# Patient Record
Sex: Female | Born: 1995
Health system: Southern US, Community
[De-identification: ages and names within clinical notes are randomized; demographics above are authoritative.]

## PROBLEM LIST (undated history)

## (undated) DIAGNOSIS — L700 Acne vulgaris: Secondary | ICD-10-CM

## (undated) DIAGNOSIS — F909 Attention-deficit hyperactivity disorder, unspecified type: Secondary | ICD-10-CM

## (undated) HISTORY — DX: Acne vulgaris: L70.0

## (undated) HISTORY — PX: NO PAST SURGERIES: SHX2092

## (undated) HISTORY — DX: Attention-deficit hyperactivity disorder, unspecified type: F90.9

---
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
## 2015-03-17 ENCOUNTER — Inpatient Hospital Stay (HOSPITAL_COMMUNITY)
Admission: AD | Admit: 2015-03-17 | Discharge: 2015-03-17 | Disposition: A | Discharge status: Home / Self Care | Payer: No Typology Code available for payment source | Source: Ambulatory Visit | Attending: Emergency Medicine | Admitting: Emergency Medicine

## 2015-03-17 ENCOUNTER — Inpatient Hospital Stay (HOSPITAL_COMMUNITY): Payer: No Typology Code available for payment source

## 2015-03-17 ENCOUNTER — Encounter (HOSPITAL_COMMUNITY): Payer: Self-pay

## 2015-03-17 DIAGNOSIS — F909 Attention-deficit hyperactivity disorder, unspecified type: Secondary | ICD-10-CM | POA: Diagnosis not present

## 2015-03-17 DIAGNOSIS — Z79899 Other long term (current) drug therapy: Secondary | ICD-10-CM | POA: Diagnosis not present

## 2015-03-17 DIAGNOSIS — R52 Pain, unspecified: Secondary | ICD-10-CM

## 2015-03-17 DIAGNOSIS — R112 Nausea with vomiting, unspecified: Secondary | ICD-10-CM

## 2015-03-17 DIAGNOSIS — Z87828 Personal history of other (healed) physical injury and trauma: Secondary | ICD-10-CM | POA: Insufficient documentation

## 2015-03-17 DIAGNOSIS — N200 Calculus of kidney: Secondary | ICD-10-CM | POA: Diagnosis not present

## 2015-03-17 DIAGNOSIS — Z872 Personal history of diseases of the skin and subcutaneous tissue: Secondary | ICD-10-CM | POA: Diagnosis not present

## 2015-03-17 DIAGNOSIS — R109 Unspecified abdominal pain: Secondary | ICD-10-CM | POA: Diagnosis present

## 2015-03-17 DIAGNOSIS — Z3202 Encounter for pregnancy test, result negative: Secondary | ICD-10-CM | POA: Insufficient documentation

## 2015-03-17 LAB — CBC
HCT: 36.4 % (ref 36.0–46.0)
Hemoglobin: 12.6 g/dL (ref 12.0–15.0)
MCH: 29.4 pg (ref 26.0–34.0)
MCHC: 34.6 g/dL (ref 30.0–36.0)
MCV: 85 fL (ref 78.0–100.0)
Platelets: 294 10*3/uL (ref 150–400)
RBC: 4.28 MIL/uL (ref 3.87–5.11)
RDW: 12.7 % (ref 11.5–15.5)
WBC: 8.2 10*3/uL (ref 4.0–10.5)

## 2015-03-17 LAB — URINALYSIS, ROUTINE W REFLEX MICROSCOPIC
Bilirubin Urine: NEGATIVE
Glucose, UA: NEGATIVE mg/dL
Ketones, ur: NEGATIVE mg/dL
Leukocytes, UA: NEGATIVE
Nitrite: NEGATIVE
PROTEIN: NEGATIVE mg/dL
Specific Gravity, Urine: >1.03 — ABNORMAL HIGH (ref 1.005–1.030)
UROBILINOGEN UA: 0.2 mg/dL (ref 0.0–1.0)
pH: 5.5 (ref 5.0–8.0)

## 2015-03-17 LAB — URINE MICROSCOPIC-ADD ON

## 2015-03-17 LAB — WET PREP, GENITAL
Clue Cells Wet Prep HPF POC: NONE SEEN
Trich, Wet Prep: NONE SEEN
Yeast Wet Prep HPF POC: NONE SEEN

## 2015-03-17 LAB — POCT PREGNANCY, URINE: PREG TEST UR: NEGATIVE

## 2015-03-17 MED ORDER — HYDROMORPHONE HCL 1 MG/ML IJ SOLN
1.0000 mg | Freq: Once | INTRAMUSCULAR | Status: AC
  Administered 2015-03-17: 1 mg via INTRAVENOUS
  Filled 2015-03-17: qty 1

## 2015-03-17 MED ORDER — SODIUM CHLORIDE 0.9 % IV SOLN
25.0000 mg | Freq: Once | INTRAVENOUS | Status: AC
  Administered 2015-03-17: 25 mg via INTRAVENOUS
  Filled 2015-03-17: qty 1

## 2015-03-17 MED ORDER — OXYCODONE-ACETAMINOPHEN 7.5-325 MG PO TABS: 1.0000 | ORAL_TABLET | Freq: Once | ORAL | Status: DC

## 2015-03-17 MED ORDER — TAMSULOSIN HCL 0.4 MG PO CAPS: 0.4000 mg | ORAL_CAPSULE | Freq: Every day | ORAL | Status: DC

## 2015-03-17 MED ORDER — OXYCODONE-ACETAMINOPHEN 5-325 MG PO TABS
1.0000 | ORAL_TABLET | Freq: Once | ORAL | Status: DC
  Filled 2015-03-17: qty 1

## 2015-03-17 MED ORDER — SODIUM CHLORIDE 0.9 % IV SOLN
8.0000 mg | Freq: Once | INTRAVENOUS | Status: AC
  Administered 2015-03-17: 8 mg via INTRAVENOUS
  Filled 2015-03-17: qty 4

## 2015-03-17 MED ORDER — ONDANSETRON HCL 4 MG PO TABS: 4.0000 mg | ORAL_TABLET | Freq: Four times a day (QID) | ORAL | Status: DC

## 2015-03-17 MED ORDER — HYDROCODONE-ACETAMINOPHEN 5-325 MG PO TABS: 1.0000 | ORAL_TABLET | Freq: Four times a day (QID) | ORAL | Status: DC | PRN

## 2015-03-17 NOTE — MAU Note (Signed)
Pt states pain on r flank only since 0500 this am. Denies abnormal vaginal d/c. Does have increased frequency and urgency.

## 2015-03-17 NOTE — Discharge Instructions (Signed)
- Call [facility] to set up a follow up appointment - Strain all urine and monitor for stone passing   Kidney Stones Kidney stones (urolithiasis) are deposits that form inside your kidneys. The intense pain is caused by the stone moving through the urinary tract. When the stone moves, the ureter goes into spasm around the stone. The stone is usually passed in the urine.  CAUSES   A disorder that makes certain neck glands produce too much parathyroid hormone (primary hyperparathyroidism).  A buildup of uric acid crystals, similar to gout in your joints.  Narrowing (stricture) of the ureter.  A kidney obstruction present at birth (congenital obstruction).  Previous surgery on the kidney or ureters.  Numerous kidney infections. SYMPTOMS   Feeling sick to your stomach (nauseous).  Throwing up (vomiting).  Blood in the urine (hematuria).  Pain that usually spreads (radiates) to the groin.  Frequency or urgency of urination. DIAGNOSIS   Taking a history and physical exam.  Blood or urine tests.  CT scan.  Occasionally, an examination of the inside of the urinary bladder (cystoscopy) is performed. TREATMENT   Observation.  Increasing your fluid intake.  Extracorporeal shock wave lithotripsy--This is a noninvasive procedure that uses shock waves to break up kidney stones.  Surgery may be needed if you have severe pain or persistent obstruction. There are various surgical procedures. Most of the procedures are performed with the use of small instruments. Only small incisions are needed to accommodate these instruments, so recovery time is minimized. The size, location, and chemical composition are all important variables that will determine the proper choice of action for you. Talk to your health care provider to better understand your situation so that you will minimize the risk of injury to yourself and your kidney.  HOME CARE INSTRUCTIONS   Drink enough water and  fluids to keep your urine clear or pale yellow. This will help you to pass the stone or stone fragments.  Strain all urine through the provided strainer. Keep all particulate matter and stones for your health care provider to see. The stone causing the pain may be as small as a grain of salt. It is very important to use the strainer each and every time you pass your urine. The collection of your stone will allow your health care provider to analyze it and verify that a stone has actually passed. The stone analysis will often identify what you can do to reduce the incidence of recurrences.  Only take over-the-counter or prescription medicines for pain, discomfort, or fever as directed by your health care provider.  Make a follow-up appointment with your health care provider as directed.  Get follow-up X-rays if required. The absence of pain does not always mean that the stone has passed. It may have only stopped moving. If the urine remains completely obstructed, it can cause loss of kidney function or even complete destruction of the kidney. It is your responsibility to make sure X-rays and follow-ups are completed. Ultrasounds of the kidney can show blockages and the status of the kidney. Ultrasounds are not associated with any radiation and can be performed easily in a matter of minutes. SEEK MEDICAL CARE IF:  You experience pain that is progressive and unresponsive to any pain medicine you have been prescribed. SEEK IMMEDIATE MEDICAL CARE IF:   Pain cannot be controlled with the prescribed medicine.  You have a fever or shaking chills.  The severity or intensity of pain increases over 18 hours and  is not relieved by pain medicine.  You develop a new onset of abdominal pain.  You feel faint or pass out.  You are unable to urinate. MAKE SURE YOU:   Understand these instructions.  Will watch your condition.  Will get help right away if you are not doing well or get worse. Document  Released: 06/10/2005 Document Revised: 02/10/2013 Document Reviewed: 11/11/2012 [facility] Patient Information 2015 [place], [place]. This information is not intended to replace advice given to you by your health care provider. Make sure you discuss any questions you have with your health care provider.

## 2015-03-17 NOTE — ED Notes (Signed)
Pt states sharp rt flank pain sudden onset 5am. C/o of N/V only. Denies fever and diarrhea. Uinary problems of frequency without pain.
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
## 2015-03-17 NOTE — ED Notes (Signed)
Patient transported to CT 
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
## 2015-03-17 NOTE — MAU Note (Signed)
Pain in rt flank around to back, woke her this morning.no hx of kidney stones, hx of cysts.

## 2015-03-17 NOTE — ED Notes (Signed)
edpa at bedside. 

## 2015-03-17 NOTE — ED Notes (Signed)
Bed: ZO10 Expected date:  Expected time:  Means of arrival:  Comments: Ems-kidney stone

## 2015-03-20 LAB — GC/CHLAMYDIA PROBE AMP (~~LOC~~) NOT AT ARMC
Chlamydia: NEGATIVE
Neisseria Gonorrhea: NEGATIVE

## 2015-05-23 ENCOUNTER — Encounter: Payer: Self-pay | Admitting: Family Medicine

## 2015-05-23 ENCOUNTER — Ambulatory Visit (INDEPENDENT_AMBULATORY_CARE_PROVIDER_SITE_OTHER): Payer: No Typology Code available for payment source | Admitting: Family Medicine

## 2015-05-23 VITALS — BP 101/62 | HR 87 | Temp 98.9°F | Ht 66.5 in | Wt 173.0 lb

## 2015-05-23 DIAGNOSIS — N390 Urinary tract infection, site not specified: Secondary | ICD-10-CM

## 2015-05-23 DIAGNOSIS — R309 Painful micturition, unspecified: Secondary | ICD-10-CM | POA: Diagnosis not present

## 2015-05-23 LAB — POCT URINALYSIS DIPSTICK
Nitrite, UA: POSITIVE
PH UA: 5
SPEC GRAV UA: 1.015
Urobilinogen, UA: >=8

## 2015-05-23 MED ORDER — CIPROFLOXACIN HCL 500 MG PO TABS: 500.0000 mg | ORAL_TABLET | Freq: Two times a day (BID) | ORAL | Status: DC

## 2015-05-23 NOTE — Progress Notes (Signed)
   Subjective:    Patient ID: Jo Castro, female    DOB: 06/11/1996, 19 y.o.   MRN: [id number]  HPI Here for the onset last night of burning on urination and urgency to urinate. No nausea or fever. She is drinking water and taking AZO. She passed a ureteral stone in September, but she has never had a UTI before.    Review of Systems  Constitutional: Negative.   Respiratory: Negative.   Cardiovascular: Negative.   Gastrointestinal: Negative.   Genitourinary: Positive for dysuria, urgency and frequency. Negative for hematuria, flank pain and pelvic pain.       Objective:   Physical Exam  Constitutional: She appears well-developed and well-nourished.  Cardiovascular: Normal rate, regular rhythm, normal heart sounds and intact distal pulses.   Pulmonary/Chest: Effort normal and breath sounds normal.  Abdominal: Soft. Bowel sounds are normal. She exhibits no distension and no mass. There is no tenderness. There is no rebound and no guarding.          Assessment & Plan:  UTI, treat with Cipro. Culture is pending. Written a work note for today.

## 2015-05-23 NOTE — Progress Notes (Signed)
Pre visit review using our clinic review tool, if applicable. No additional management support is needed unless otherwise documented below in the visit note. 

## 2015-05-26 LAB — URINE CULTURE

## 2016-05-14 ENCOUNTER — Ambulatory Visit (INDEPENDENT_AMBULATORY_CARE_PROVIDER_SITE_OTHER): Payer: BLUE CROSS/BLUE SHIELD | Admitting: Family Medicine

## 2016-05-14 ENCOUNTER — Encounter: Payer: Self-pay | Admitting: Family Medicine

## 2016-05-14 VITALS — BP 110/72 | HR 80 | Resp 12 | Ht 66.5 in | Wt 173.4 lb

## 2016-05-14 DIAGNOSIS — J029 Acute pharyngitis, unspecified: Secondary | ICD-10-CM | POA: Diagnosis not present

## 2016-05-14 LAB — POCT RAPID STREP A (OFFICE): Rapid Strep A Screen: NEGATIVE

## 2016-05-14 NOTE — Addendum Note (Signed)
Addended by: Baldwin CrownJOHNSON, Ruffus Kamaka D on: 05/14/2016 01:15 PM   Modules accepted: Orders
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
## 2016-05-14 NOTE — Patient Instructions (Signed)
  [name] I have seen you today for an acute visit.  1. Acute pharyngitis, unspecified etiology Will follow strep culture.   Symptomatic treatment: Over the counter Acetaminophen 500 mg and/or Ibuprofen (400-600 mg) if there is not contraindications; you can alternate in between both every 4-6 hours. Gargles with saline water and throat lozenges might also help. Cold fluids.    Seek prompt medical evaluation if you are having difficulty breathing, mouth swelling, throat closing up, not able to swallow liquids (drooling), skin rash/bruising, or worsening symptoms.  Please follow up in 2 weeks if not any better.       In general please monitor for signs of worsening symptoms and seek immediate medical attention if any concerning/warning symptom as we discussed.   If symptoms are not resolved in a few days/weeks you should schedule a follow up appointment with your doctor, before if needed.  Please be sure you have an appointment already scheduled with your PCP before you leave today.

## 2016-05-16 LAB — CULTURE, GROUP A STREP: ORGANISM ID, BACTERIA: NORMAL

## 2016-08-19 IMAGING — CT CT RENAL STONE PROTOCOL
2 of 4 series · 17 of 46 positions shown, 19 images · non-contrast
Comparison: 11/24/2013

CLINICAL DATA: Acute right side pain starting 5 a.m. this morning.
Evaluate for kidney stones. Nausea, vomiting.

EXAM:
CT ABDOMEN AND PELVIS WITHOUT CONTRAST
TECHNIQUE: Multidetector CT imaging of the abdomen and pelvis was performed
following the standard protocol without IV contrast.

[Series 2: under 200# stone no prev · axial · 0.79mm/px · z∈[-742,-302]mm · 14 of 97 slices shown, 16 images]
[im 5/97  soft-tissue]
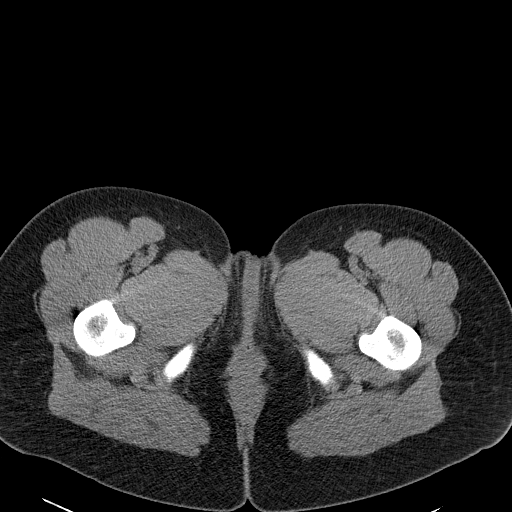
[im 5/97  bone]
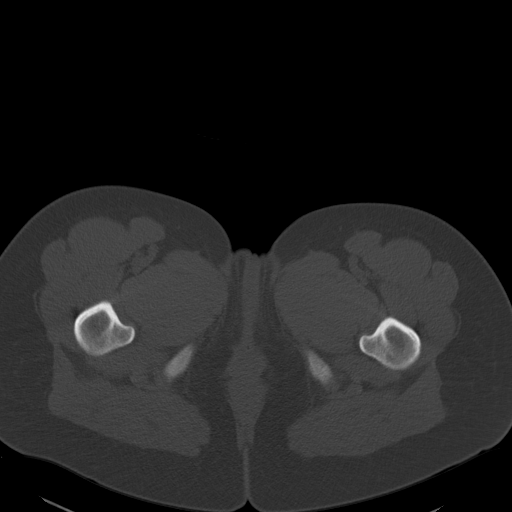
[im 13/97  soft-tissue]
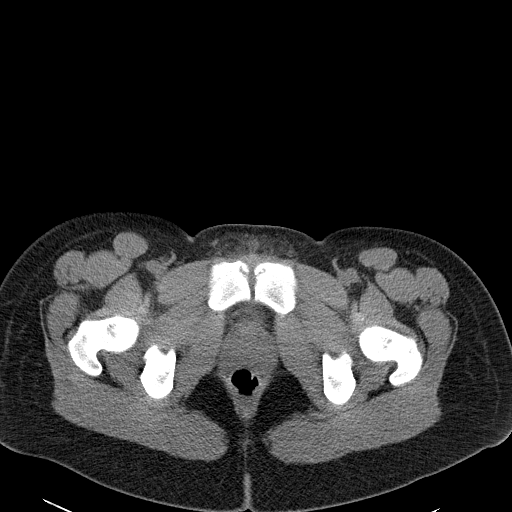
[im 21/97  soft-tissue]
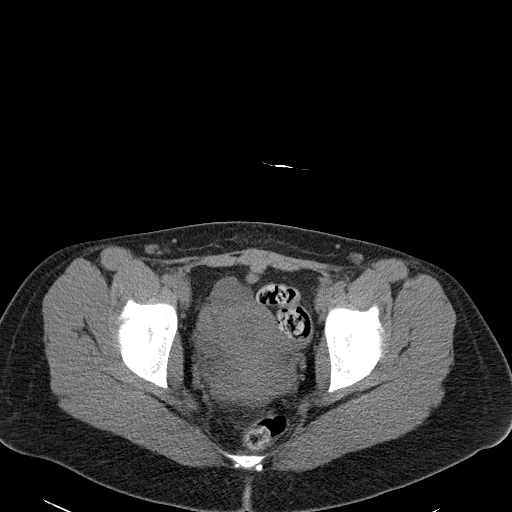
[im 25/97  soft-tissue]
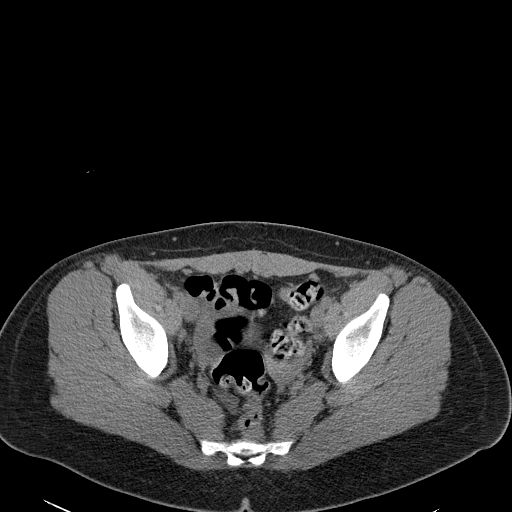
[im 33/97  soft-tissue]
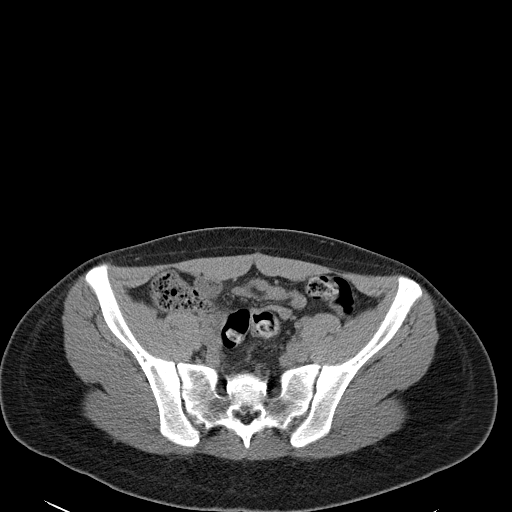
[im 41/97  soft-tissue]
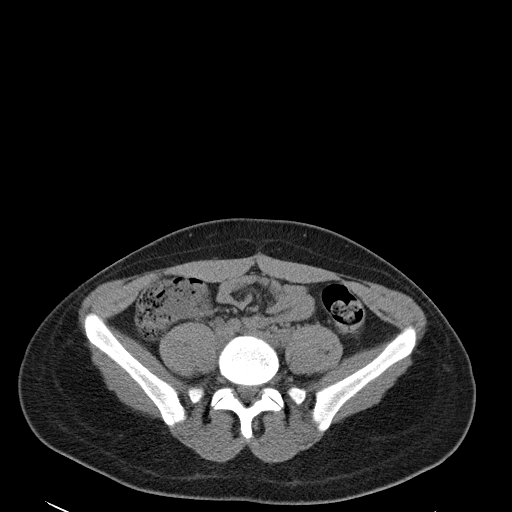
[im 45/97  soft-tissue]
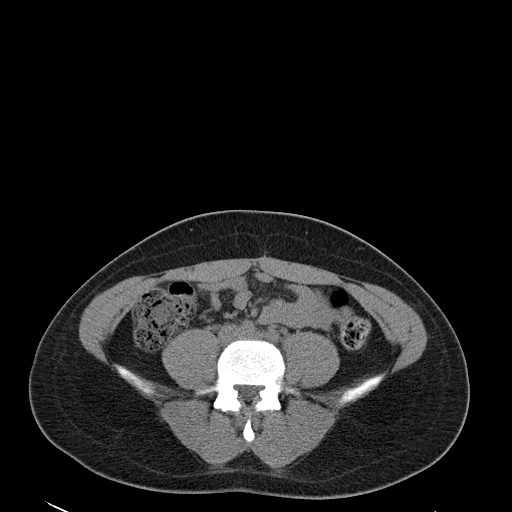
[im 53/97  soft-tissue]
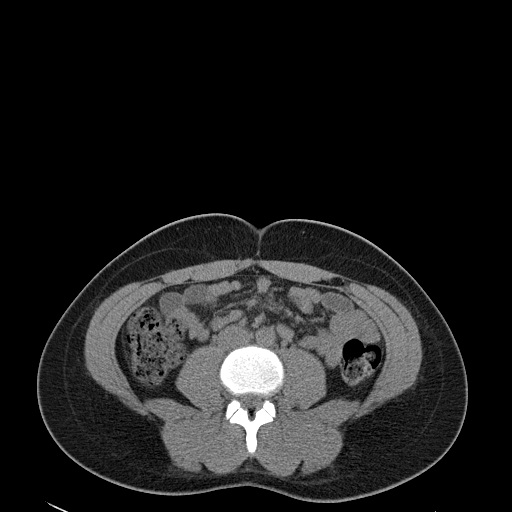
[im 57/97  soft-tissue]
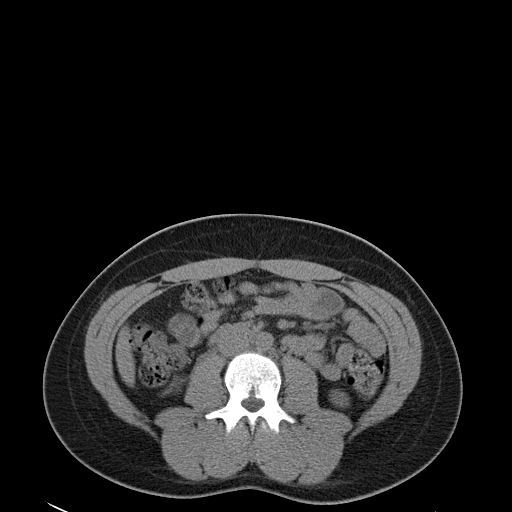
[im 57/97  bone]
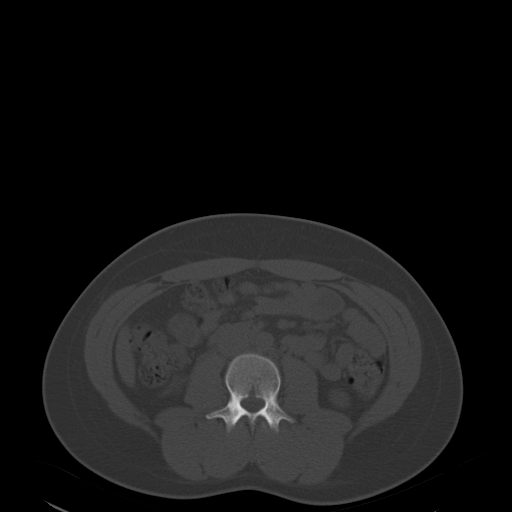
[im 65/97  soft-tissue]
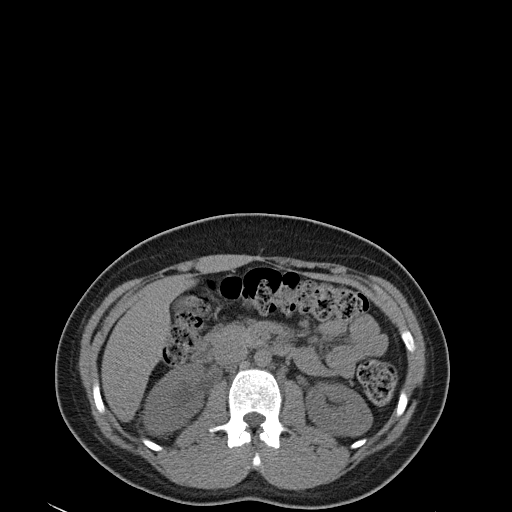
[im 73/97  soft-tissue]
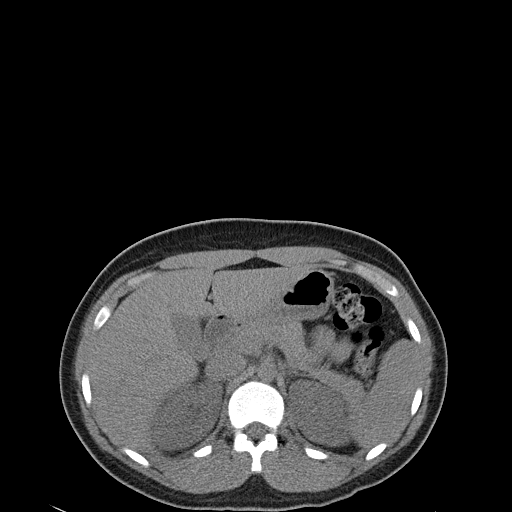
[im 77/97  soft-tissue]
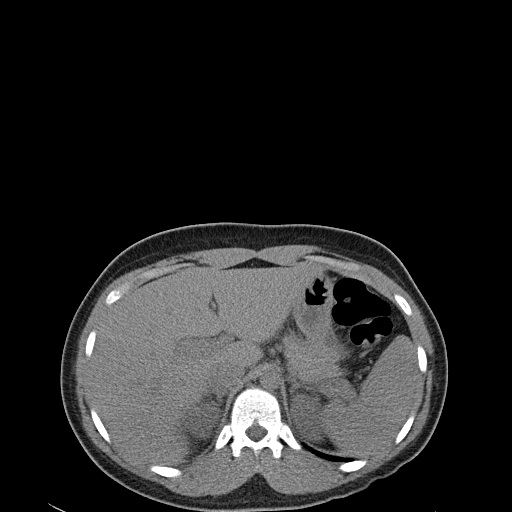
[im 85/97  soft-tissue]
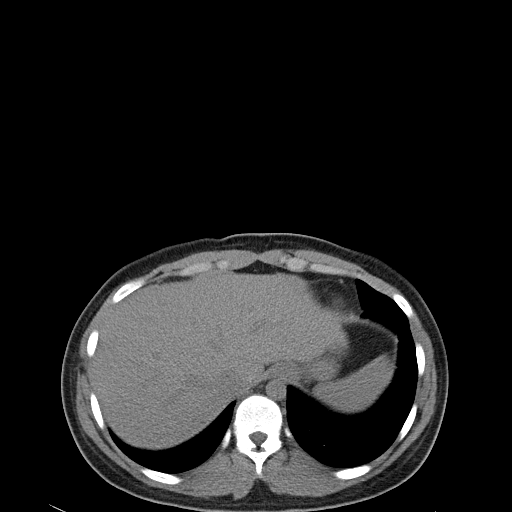
[im 93/97  soft-tissue]
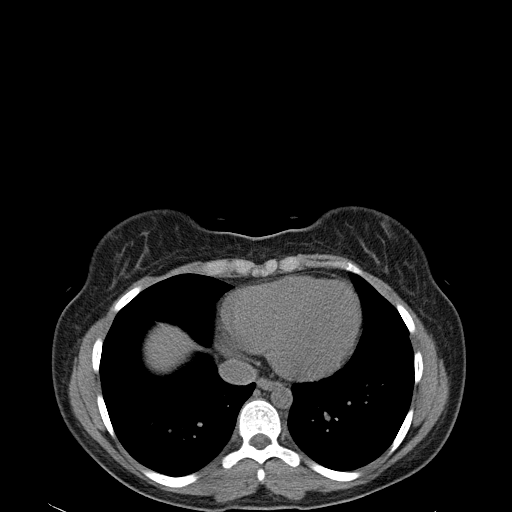

[Series 602: <mpr thick range> · coronal · 0.95mm/px · 3 of 79 slices shown]
[im 27/79  soft-tissue]
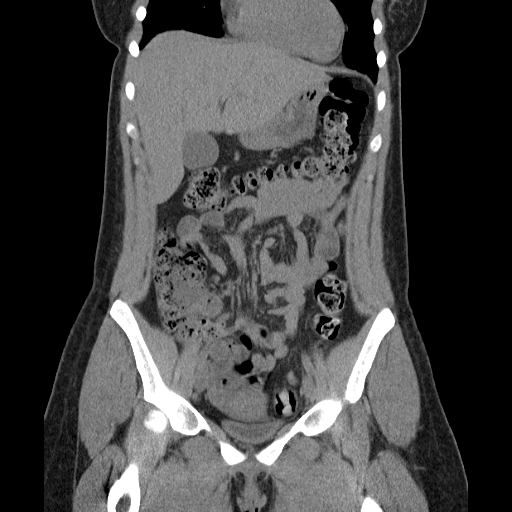
[im 35/79  soft-tissue]
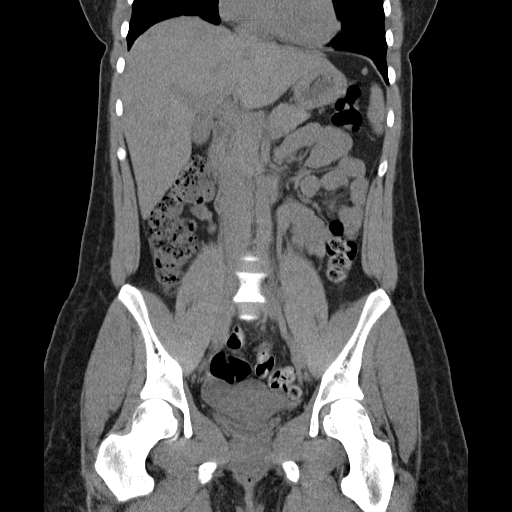
[im 44/79  soft-tissue]
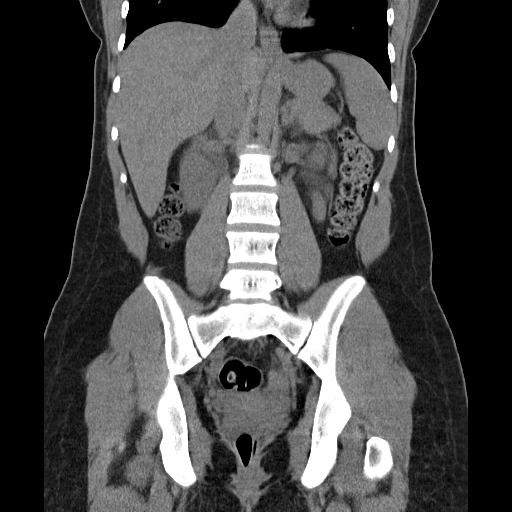

[17 of 46 positions shown; findings below may reference images not displayed]

FINDINGS: Lung bases are clear.  No effusions.  Heart is normal size.

Liver, gallbladder, spleen, pancreas, adrenals adrenals and left
kidney have an unremarkable unenhanced appearance. There is mild
right hydronephrosis and perinephric stranding. Punctate
calcification noted in the lower right pelvis likely distal right
ureteral stone, 1-2 mm.

Stomach, large and small bowel are unremarkable. Trace free fluid in
the pelvis. Uterus, adnexae a unremarkable. Urinary bladder is
decompressed.

Aorta is normal caliber. No acute bony abnormality or focal bone
lesion.
IMPRESSION: Punctate 1-2 mm distal right ureteral stone with mild right
hydronephrosis.

## 2016-08-19 IMAGING — US US RENAL
1 series · 15 of 25 positions shown · non-contrast
Comparison: CT scan of November 24, 2013.

CLINICAL DATA: Acute right flank pain.

EXAM:
RENAL / URINARY TRACT ULTRASOUND COMPLETE

[Series 1: us renal · 15 of 29 slices shown]
[im 1/29]
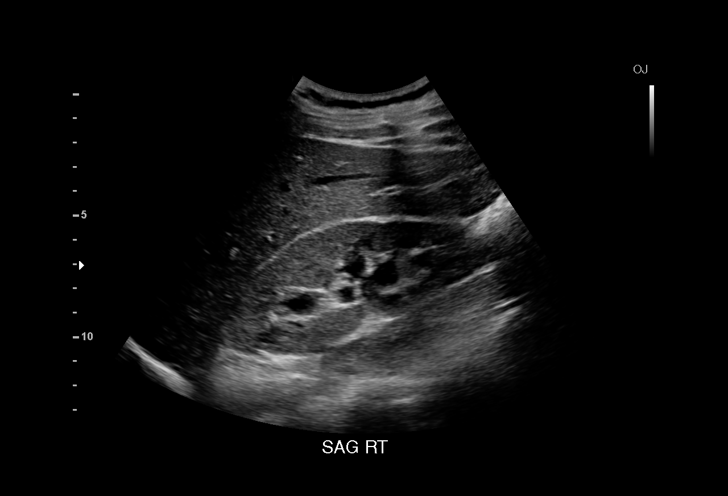
[im 3/29]
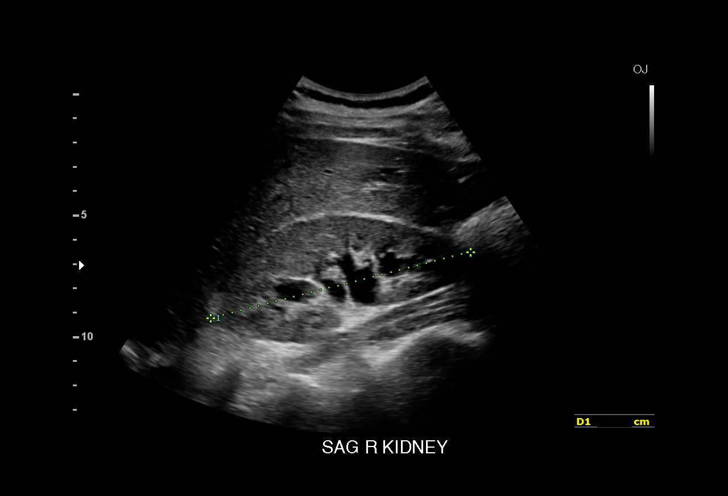
[im 5/29]
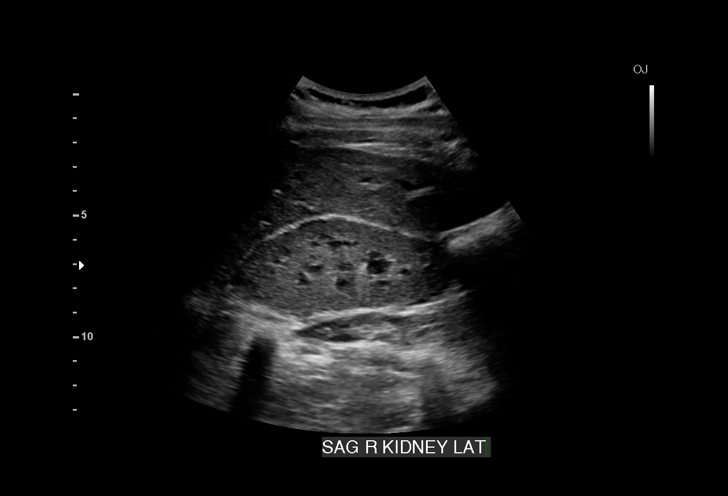
[im 6/29]
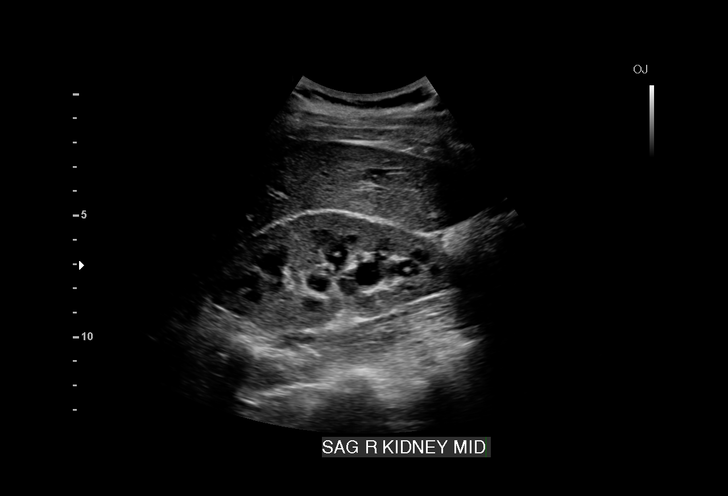
[im 9/29]
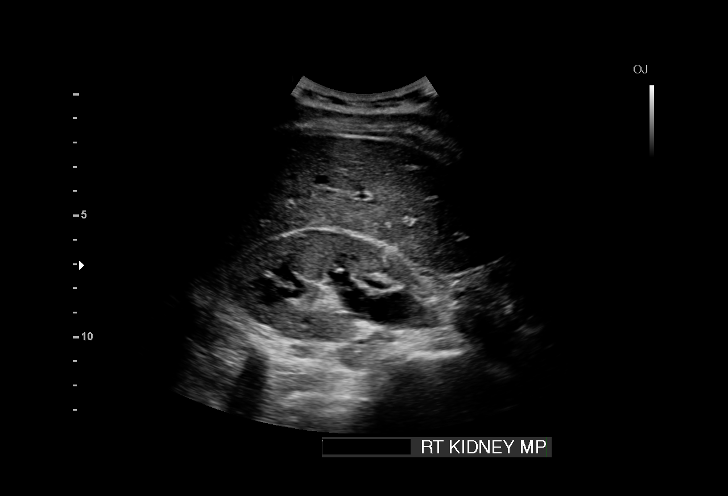
[im 11/29]
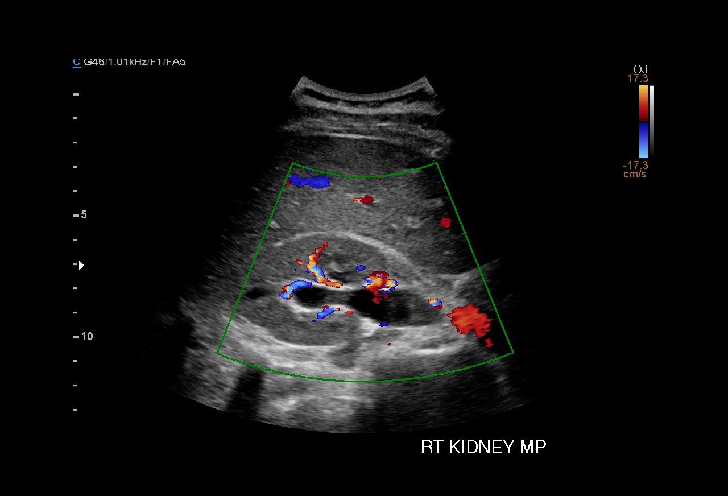
[im 12/29]
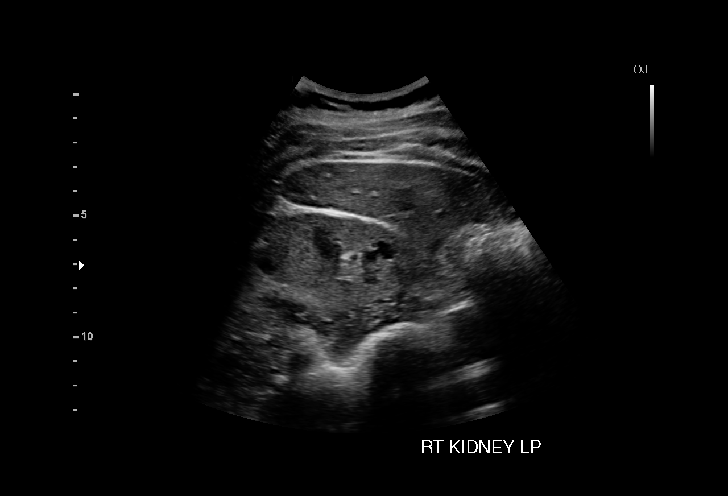
[im 15/29]
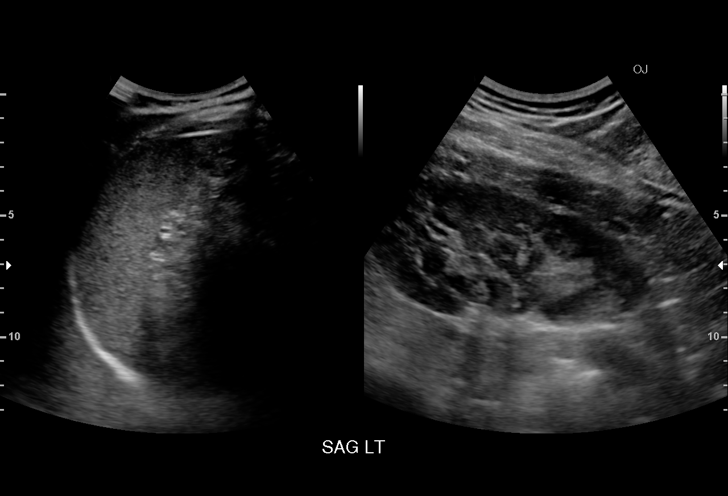
[im 17/29]
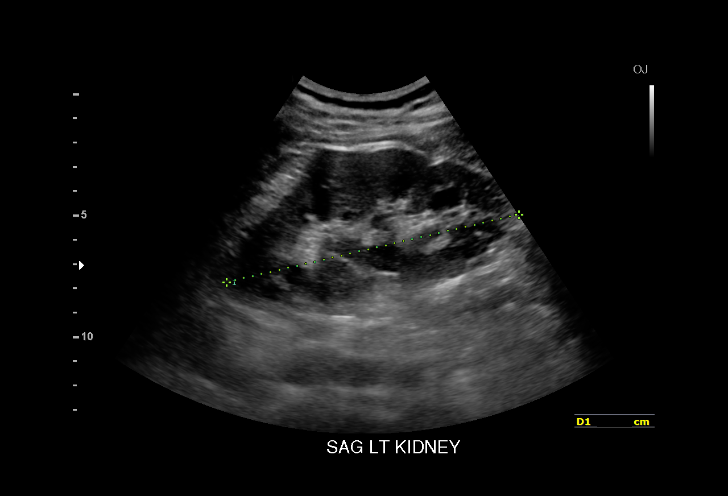
[im 18/29]
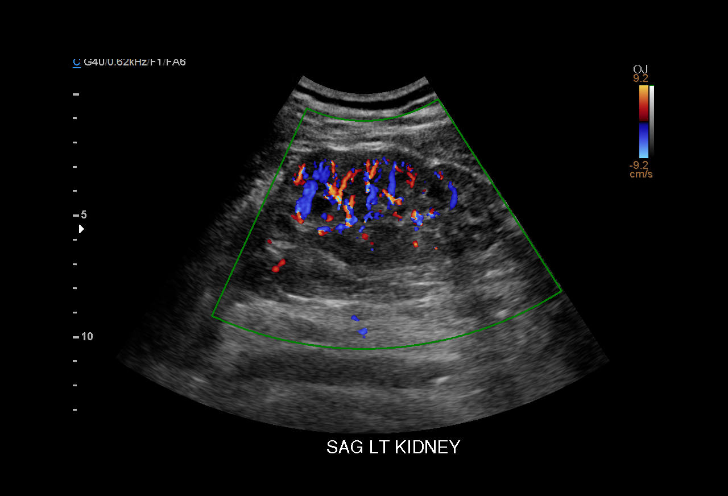
[im 20/29]
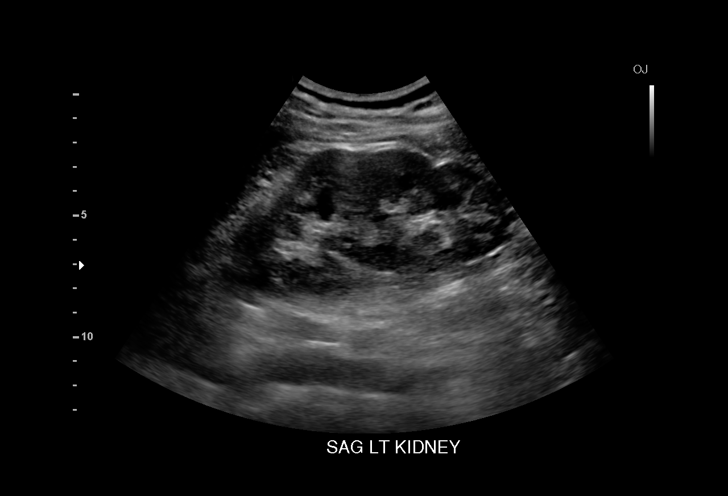
[im 23/29]
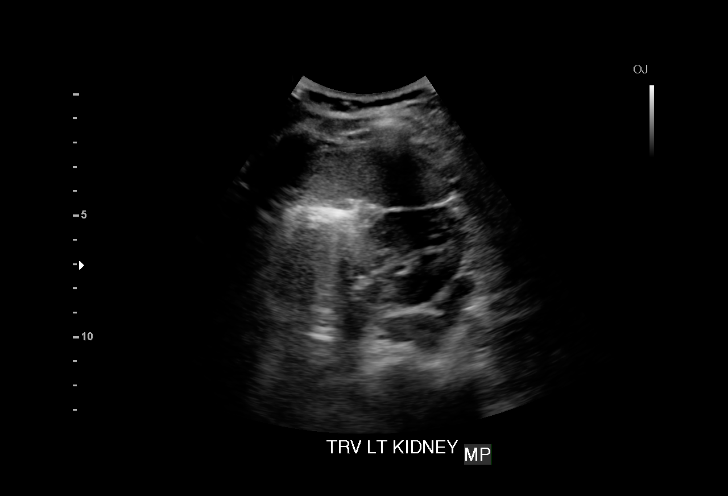
[im 24/29]
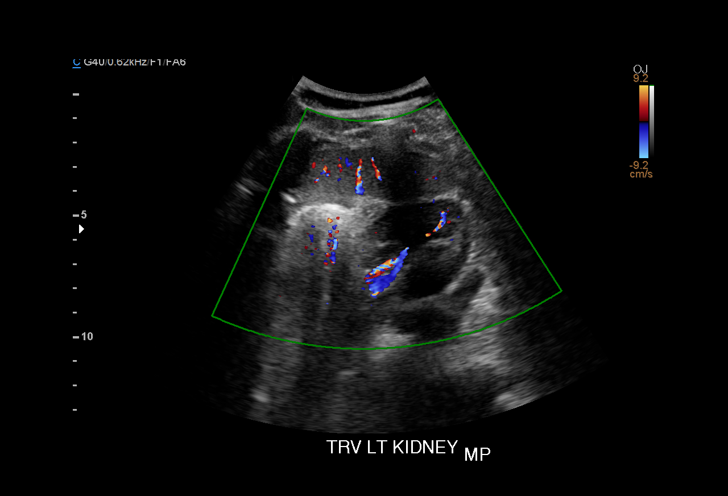
[im 26/29]
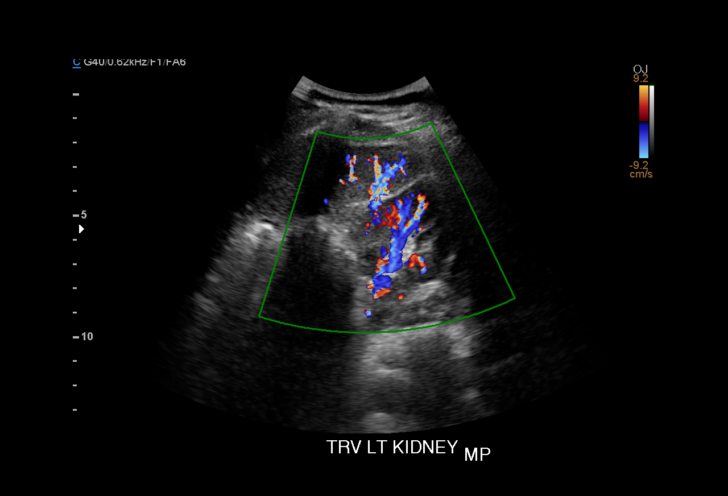
[im 29/29]
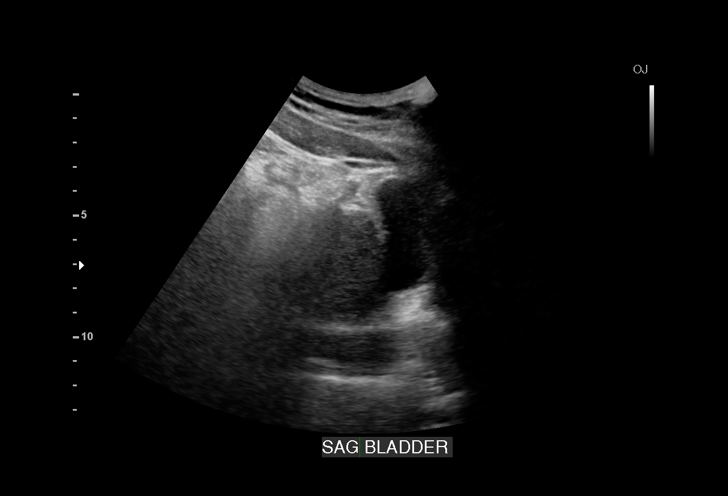

[15 of 25 positions shown; findings below may reference images not displayed]

FINDINGS: Right Kidney:

Length: 11 cm. Echogenicity within normal limits. No mass or
hydronephrosis visualized.

Left Kidney:

Length: 12.3 cm. Echogenicity within normal limits. No mass or
hydronephrosis visualized.

Bladder:

Appears normal for degree of bladder distention.
IMPRESSION: Normal renal ultrasound.

## 2017-08-26 DIAGNOSIS — Z3042 Encounter for surveillance of injectable contraceptive: Secondary | ICD-10-CM | POA: Diagnosis not present

## 2017-10-22 DIAGNOSIS — Z01419 Encounter for gynecological examination (general) (routine) without abnormal findings: Secondary | ICD-10-CM | POA: Diagnosis not present

## 2017-10-22 DIAGNOSIS — Z124 Encounter for screening for malignant neoplasm of cervix: Secondary | ICD-10-CM | POA: Diagnosis not present

## 2017-11-18 DIAGNOSIS — Z3042 Encounter for surveillance of injectable contraceptive: Secondary | ICD-10-CM | POA: Diagnosis not present

## 2018-01-09 ENCOUNTER — Ambulatory Visit: Payer: 59 | Admitting: Family Medicine

## 2018-01-09 ENCOUNTER — Encounter: Payer: Self-pay | Admitting: Family Medicine

## 2018-01-09 ENCOUNTER — Ambulatory Visit: Payer: BLUE CROSS/BLUE SHIELD | Admitting: Family Medicine

## 2018-01-09 VITALS — BP 120/66 | HR 102 | Temp 98.5°F | Ht 66.5 in | Wt 212.2 lb

## 2018-01-09 DIAGNOSIS — L03115 Cellulitis of right lower limb: Secondary | ICD-10-CM | POA: Diagnosis not present

## 2018-01-09 DIAGNOSIS — F900 Attention-deficit hyperactivity disorder, predominantly inattentive type: Secondary | ICD-10-CM

## 2018-01-09 MED ORDER — DOXYCYCLINE HYCLATE 100 MG PO CAPS: 100.0000 mg | ORAL_CAPSULE | Freq: Two times a day (BID) | ORAL | 0 refills | Status: AC

## 2018-01-09 MED ORDER — AMPHETAMINE-DEXTROAMPHET ER 20 MG PO CP24: 20.0000 mg | ORAL_CAPSULE | ORAL | 0 refills | Status: DC

## 2018-01-09 NOTE — Progress Notes (Signed)
   Subjective:    Patient ID: Jo Castro, female    DOB: 03/21/1996, 22 y.o.   MRN: [id number]  HPI Here for 2 issues. First 4 days ago she felt a sudden stinging pain in the right lower leg while she was riding her horse in a field. When she got home and took off her pants, she saw a small bite mark. Over the next few days an area of redness spread around this mark and this is now tender. No fever. Also she wants to get back on ADHD medication again. 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 She plans to take the medication only on work days.    Review of Systems  Constitutional: Negative.   Respiratory: Negative.   Cardiovascular: Negative.   Skin: Positive for wound.  Neurological: Negative.   Psychiatric/Behavioral: Positive for decreased concentration. Negative for dysphoric mood. The patient is not nervous/anxious.        Objective:   Physical Exam  Constitutional: She is oriented to person, place, and time. She appears well-developed and well-nourished.  Cardiovascular: Normal rate, regular rhythm, normal heart sounds and intact distal pulses.  Pulmonary/Chest: Effort normal and breath sounds normal.  Neurological: She is alert and oriented to person, place, and time.  Skin:  The right lateral lower leg has a small puncture mark surrounded by erythema which is warm and tender           Assessment & Plan:  She has some cellulitis from a probable spider bite, and we will treat with Doxycycline. Treat the ADHD with Adderall XR 20 mg once a day. Gershon CraneStephen Jennifr Gaeta, MD

## 2018-01-13 ENCOUNTER — Telehealth: Payer: Self-pay

## 2018-01-13 NOTE — Telephone Encounter (Signed)
Sent to [facility]  

## 2018-01-13 NOTE — Telephone Encounter (Signed)
PA needed for D-amphetamine ER 20 MG

## 2018-01-16 NOTE — Telephone Encounter (Signed)
Prior auth for Amphetamine-dextro XR 20mg  sent to [email or web address] key [id number].   Approval given and I called [facility] and informed Siva of this.

## 2018-02-10 DIAGNOSIS — Z3042 Encounter for surveillance of injectable contraceptive: Secondary | ICD-10-CM | POA: Diagnosis not present

## 2018-03-26 ENCOUNTER — Telehealth: Payer: Self-pay | Admitting: Family Medicine

## 2018-03-26 MED ORDER — AMPHETAMINE-DEXTROAMPHET ER 30 MG PO CP24: 30.0000 mg | ORAL_CAPSULE | Freq: Every day | ORAL | 0 refills | Status: DC

## 2018-03-26 NOTE — Telephone Encounter (Signed)
Dr. Clent Ridges please advise or will she need OV for this.  Thanks

## 2018-03-26 NOTE — Telephone Encounter (Signed)
Copied from CRM [id number]. Topic: Quick Communication - Rx Refill/Question >> Mar 26, 2018  7:42 AM Crist Infante wrote: Medication: amphetamine-dextroamphetamine (ADDERALL XR) 20 MG 24 hr capsule Pt wants to know if she can get a higher dose of this med.  She states not as effective as it used to be. Last OV 01/09/18  [id number] - [place], [place] - [address] AT [facility] OF [address] & [facility] [address] [phone] (Phone) [phone] (Fax)

## 2018-03-26 NOTE — Telephone Encounter (Signed)
lmomtcb x 1 to make the pt aware of new rx called to the pharmacy per Dr. Clent Ridges.

## 2018-03-26 NOTE — Telephone Encounter (Signed)
I sent in a month supply of Adderall XR 30 mg daily to try

## 2018-03-27 NOTE — Telephone Encounter (Signed)
I have called and lmom to make the pt aware of rx sent to her pharmacy.

## 2018-05-12 DIAGNOSIS — Z3042 Encounter for surveillance of injectable contraceptive: Secondary | ICD-10-CM | POA: Diagnosis not present

## 2018-06-29 ENCOUNTER — Ambulatory Visit: Payer: Self-pay | Admitting: *Deleted

## 2018-06-29 NOTE — Telephone Encounter (Signed)
Noted  

## 2018-06-29 NOTE — Telephone Encounter (Signed)
Message from [facility] sent at 06/29/2018 9:49 AM EST   Pt mother Dewayne Hatch stated she is certain pt has the flu and the pharmacy advised her to call and request a Rx for Tamiflu. Pt mother requests call back to advise.    Left message on pt's voicemail to return call to the office to discuss symptoms.

## 2018-06-29 NOTE — Telephone Encounter (Signed)
Attempted to call mother again in regards to patient to attempt triage.  This is the second attempt / No answer / Will send to providers office for review /

## 2018-07-03 DIAGNOSIS — N200 Calculus of kidney: Secondary | ICD-10-CM | POA: Diagnosis not present

## 2018-07-03 DIAGNOSIS — N202 Calculus of kidney with calculus of ureter: Secondary | ICD-10-CM | POA: Diagnosis not present

## 2018-07-03 DIAGNOSIS — R1032 Left lower quadrant pain: Secondary | ICD-10-CM | POA: Diagnosis not present

## 2018-07-03 DIAGNOSIS — N1330 Unspecified hydronephrosis: Secondary | ICD-10-CM | POA: Diagnosis not present

## 2018-07-03 DIAGNOSIS — N201 Calculus of ureter: Secondary | ICD-10-CM | POA: Diagnosis not present

## 2018-07-04 ENCOUNTER — Telehealth: Payer: Self-pay | Admitting: Family Medicine

## 2018-07-04 MED ORDER — HYDROCODONE-ACETAMINOPHEN 5-325 MG PO TABS: 1.0000 | ORAL_TABLET | ORAL | 0 refills | Status: DC | PRN

## 2018-07-04 NOTE — Telephone Encounter (Signed)
She is in [place] with her family and has a 3.5 mm stone in the distal left ureter. She went to the ER on 07-02-18 and has run out of pain medication. She will fly home to [facility] on Monday to follow up with Urology here.

## 2018-08-04 DIAGNOSIS — Z3042 Encounter for surveillance of injectable contraceptive: Secondary | ICD-10-CM | POA: Diagnosis not present

## 2018-09-01 ENCOUNTER — Encounter: Payer: Self-pay | Admitting: Family Medicine

## 2018-09-01 ENCOUNTER — Ambulatory Visit (INDEPENDENT_AMBULATORY_CARE_PROVIDER_SITE_OTHER): Payer: 59 | Admitting: Family Medicine

## 2018-09-01 VITALS — BP 120/70 | HR 82 | Temp 98.6°F | Wt 230.1 lb

## 2018-09-01 DIAGNOSIS — Z23 Encounter for immunization: Secondary | ICD-10-CM | POA: Diagnosis not present

## 2018-09-01 DIAGNOSIS — S61213A Laceration without foreign body of left middle finger without damage to nail, initial encounter: Secondary | ICD-10-CM | POA: Diagnosis not present

## 2018-09-01 NOTE — Progress Notes (Deleted)
  Subjective:     Patient ID: Jo Castro, female   DOB: 09/21/1995, 23 y.o.   MRN: [id number]  HPI   Review of Systems     Objective:   Physical Exam     Assessment:     ***    Plan:     ***

## 2018-09-01 NOTE — Progress Notes (Signed)
   Subjective:    Patient ID: Jo Castro, female    DOB: [birth year], 23 y.o.   MRN: [id number]  HPI Here to check an injury to her left 3rd finger that occurred 2 days ago. While working with her horse, the horse jerked and a metal clasp broke, striking the finger. She has been dressing it with Neosporin daily. It is slightly tender only.    Review of Systems  Constitutional: Negative.   Respiratory: Negative.   Cardiovascular: Negative.   Skin: Positive for wound.       Objective:   Physical Exam Constitutional:      Appearance: Normal appearance.  Cardiovascular:     Rate and Rhythm: Normal rate and regular rhythm.     Pulses: Normal pulses.     Heart sounds: Normal heart sounds.  Pulmonary:     Effort: Pulmonary effort is normal.     Breath sounds: Normal breath sounds.  Skin:    Comments: The pad of the left 3rd finger has a laceration with a skin flap in place on top of the wound. It is clean and not tender   Neurological:     Mental Status: She is alert.           Assessment & Plan:  Finger laceration, this is healing nicely. She will dress it daily with neosporin and a Bandaid. Given a TDaP.  Gershon Crane, MD

## 2018-09-01 NOTE — Addendum Note (Signed)
Addended by: Marcellus Scott on: 09/01/2018 08:44 AM   Modules accepted: Orders

## 2018-10-23 DIAGNOSIS — M25572 Pain in left ankle and joints of left foot: Secondary | ICD-10-CM | POA: Diagnosis not present

## 2018-10-23 DIAGNOSIS — S82832A Other fracture of upper and lower end of left fibula, initial encounter for closed fracture: Secondary | ICD-10-CM | POA: Diagnosis not present

## 2018-10-30 DIAGNOSIS — Z3042 Encounter for surveillance of injectable contraceptive: Secondary | ICD-10-CM | POA: Diagnosis not present

## 2020-09-29 ENCOUNTER — Other Ambulatory Visit: Payer: Self-pay

## 2020-09-29 ENCOUNTER — Ambulatory Visit: Payer: 59 | Admitting: Family Medicine

## 2020-09-29 ENCOUNTER — Encounter: Payer: Self-pay | Admitting: Family Medicine

## 2020-09-29 VITALS — BP 90/50 | HR 68 | Temp 98.4°F | Ht 66.5 in | Wt 194.6 lb

## 2020-09-29 DIAGNOSIS — N390 Urinary tract infection, site not specified: Secondary | ICD-10-CM

## 2020-09-29 DIAGNOSIS — R3915 Urgency of urination: Secondary | ICD-10-CM

## 2020-09-29 LAB — POCT URINALYSIS DIPSTICK
Bilirubin, UA: NEGATIVE
Blood, UA: NEGATIVE
Glucose, UA: NEGATIVE
Ketones, UA: NEGATIVE
Nitrite, UA: NEGATIVE
Protein, UA: NEGATIVE
Spec Grav, UA: 1.02 (ref 1.010–1.025)
Urobilinogen, UA: 0.2 E.U./dL
pH, UA: 7.5 (ref 5.0–8.0)

## 2020-09-29 MED ORDER — CIPROFLOXACIN HCL 500 MG PO TABS: 500.0000 mg | ORAL_TABLET | Freq: Two times a day (BID) | ORAL | 0 refills | Status: DC

## 2020-09-29 NOTE — Progress Notes (Signed)
   Subjective:    Patient ID: Jo Castro, female    DOB: 1996/01/09, 25 y.o.   MRN: [id number]  HPI Here for 4 days of urinary urgency and frequency. No pain or nausea or fever.    Review of Systems  Constitutional: Negative.   Respiratory: Negative.   Cardiovascular: Negative.   Gastrointestinal: Negative.   Genitourinary: Positive for frequency and urgency. Negative for dysuria and flank pain.       Objective:   Physical Exam Constitutional:      Appearance: Normal appearance. She is not ill-appearing.  Cardiovascular:     Rate and Rhythm: Normal rate and regular rhythm.     Pulses: Normal pulses.     Heart sounds: Normal heart sounds.  Pulmonary:     Effort: Pulmonary effort is normal.     Breath sounds: Normal breath sounds.  Abdominal:     General: Abdomen is flat. Bowel sounds are normal. There is no distension.     Palpations: Abdomen is soft. There is no mass.     Tenderness: There is no abdominal tenderness. There is no right CVA tenderness, left CVA tenderness, guarding or rebound.     Hernia: No hernia is present.  Neurological:     Mental Status: She is alert.           Assessment & Plan:  UTI, treat with Cipro. Culture the sample.  Gershon Crane, MD

## 2020-09-30 LAB — URINE CULTURE
MICRO NUMBER:: 11748468
SPECIMEN QUALITY:: ADEQUATE

## 2021-08-16 LAB — HM PAP SMEAR: HM Pap smear: NEGATIVE

## 2022-03-22 ENCOUNTER — Ambulatory Visit (INDEPENDENT_AMBULATORY_CARE_PROVIDER_SITE_OTHER): Payer: 59 | Admitting: Family Medicine

## 2022-03-22 ENCOUNTER — Encounter: Payer: Self-pay | Admitting: Family Medicine

## 2022-03-22 VITALS — BP 110/78 | HR 71 | Temp 98.6°F | Wt 194.1 lb

## 2022-03-22 DIAGNOSIS — F900 Attention-deficit hyperactivity disorder, predominantly inattentive type: Secondary | ICD-10-CM

## 2022-03-22 MED ORDER — AMPHETAMINE-DEXTROAMPHET ER 20 MG PO CP24: 20.0000 mg | ORAL_CAPSULE | ORAL | 0 refills | Status: DC

## 2022-03-22 NOTE — Progress Notes (Signed)
   Subjective:    Patient ID: Jo Castro, female    DOB: January 11, 1996, 26 y.o.   MRN: [id number]  HPI Here asking to try ADHD medication again. She took Adderall in high school and it worked well for her. She has been off this for 4 years, and she was doing fine until about 3 months ago. 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 She has flet overwhelmed, she finds it hard to focus, and she gets anxious.    Review of Systems  Constitutional: Negative.   Respiratory: Negative.    Cardiovascular: Negative.   Psychiatric/Behavioral:  Positive for decreased concentration. Negative for dysphoric mood. The patient is nervous/anxious.        Objective:   Physical Exam Constitutional:      Appearance: Normal appearance.  Cardiovascular:     Rate and Rhythm: Normal rate and regular rhythm.     Pulses: Normal pulses.     Heart sounds: Normal heart sounds.  Pulmonary:     Effort: Pulmonary effort is normal.     Breath sounds: Normal breath sounds.  Neurological:     General: No focal deficit present.     Mental Status: She is alert and oriented to person, place, and time.  Psychiatric:        Mood and Affect: Mood normal.        Behavior: Behavior normal.        Thought Content: Thought content normal.           Assessment & Plan:  ADHD. She will get back on Adderall XR 20 mg every morning. Report back in 2 weeks.  Alysia Penna, MD

## 2022-04-23 ENCOUNTER — Telehealth: Payer: Self-pay | Admitting: Family Medicine

## 2022-04-23 MED ORDER — AMPHETAMINE-DEXTROAMPHET ER 20 MG PO CP24: 20.0000 mg | ORAL_CAPSULE | ORAL | 0 refills | Status: DC

## 2022-04-23 NOTE — Telephone Encounter (Addendum)
Pt called to say Pharmacy is saying they still have not received the refill request for   amphetamine-dextroamphetamine (ADDERALL XR) 20 MG 24 hr capsule  LOV:  03/22/22  [facility] #[id number] - [place], [place] - [address] AT [place] & [place] Phone:  [phone]  Fax:  [phone]

## 2022-04-23 NOTE — Telephone Encounter (Signed)
Done

## 2022-04-23 NOTE — Telephone Encounter (Signed)
Last OV-03/22/22 Last refill-03/22/22--30 cap, 0 refills  No future OV scheduled.

## 2022-05-27 ENCOUNTER — Telehealth: Payer: Self-pay | Admitting: Family Medicine

## 2022-05-27 NOTE — Telephone Encounter (Signed)
Pt is calling and would like a refill on amphetamine-dextroamphetamine (ADDERALL XR) 20 MG 24 hr capsule . Pt would like refill for next 3 months [facility] #[id number] [place], [place] - [address] AT [facility] OF [address] & [facility] [address] Phone: [phone]  Fax: [phone]    [facility] [id number] [place], [place] - [address] Phone: [phone]

## 2022-05-27 NOTE — Telephone Encounter (Signed)
[facility].   Patient currently have two prescriptions on file for Adderall XR 20 mgs.    Called patient to inform of this message.   Advised patient to contact pharmacy for refill.

## 2022-07-29 ENCOUNTER — Telehealth: Payer: Self-pay | Admitting: Family Medicine

## 2022-07-29 MED ORDER — AMPHETAMINE-DEXTROAMPHET ER 20 MG PO CP24: 20.0000 mg | ORAL_CAPSULE | ORAL | 0 refills | Status: DC

## 2022-07-29 NOTE — Telephone Encounter (Signed)
Done

## 2022-07-29 NOTE — Telephone Encounter (Signed)
Prescription Request  07/29/2022  Is this a "Controlled Substance" medicine? Yes  LOV: 03/22/2022  What is the name of the medication or equipment? amphetamine-dextroamphetamine (ADDERALL XR) 20 MG 24 hr capsule   Have you contacted your pharmacy to request a refill? No   Which pharmacy would you like this sent to?   [facility] [place], [place] AT [place] Phone: [phone]  Fax: [phone]      Patient notified that their request is being sent to the clinical staff for review and that they should receive a response within 2 business days.   Please advise at Mobile [phone] (mobile)

## 2022-07-29 NOTE — Telephone Encounter (Signed)
Last refill-06/22/22--30 tabs, 0 refills Last OV-03/22/22  No future OV scheduled.

## 2022-08-12 ENCOUNTER — Telehealth: Payer: Self-pay | Admitting: Family Medicine

## 2022-08-12 MED ORDER — AMPHETAMINE-DEXTROAMPHET ER 20 MG PO CP24: 20.0000 mg | ORAL_CAPSULE | ORAL | 0 refills | Status: DC

## 2022-08-12 NOTE — Telephone Encounter (Signed)
I sent in #[phone]

## 2022-08-12 NOTE — Telephone Encounter (Signed)
Prescription Request  08/12/2022  Is this a "Controlled Substance" medicine? Yes  LOV: 03/22/2022  What is the name of the medication or equipment? adderall  Have you contacted your pharmacy to request a refill? Yes   Which pharmacy would you like this sent to?  [facility], [place] AT [place] [address] Phone: [phone] Fax: [phone]    Patient notified that their request is being sent to the clinical staff for review and that they should receive a response within 2 business days.   Please advise at Mobile [phone] (mobile)

## 2022-08-13 NOTE — Telephone Encounter (Signed)
Spoke with patient.   Per patient prescription was picked up from [facility] on 08/12/22

## 2022-10-28 ENCOUNTER — Other Ambulatory Visit: Payer: Self-pay | Admitting: Family Medicine

## 2022-10-28 NOTE — Telephone Encounter (Signed)
Prescription Request  10/28/2022  LOV: 03/22/2022  What is the name of the medication or equipment? amphetamine-dextroamphetamine (ADDERALL XR) 20 MG 24 hr capsule   Have you contacted your pharmacy to request a refill? No   Which pharmacy would you like this sent to?  [facility] #[id number] [place], [place] - [address] AT [facility] OF [facility] [address] & [facility] [address] [place] [address] Phone: [phone] Fax: [phone]    Patient notified that their request is being sent to the clinical staff for review and that they should receive a response within 2 business days.   Please advise at Mobile [phone] (mobile)

## 2022-10-29 MED ORDER — AMPHETAMINE-DEXTROAMPHET ER 20 MG PO CP24: 20.0000 mg | ORAL_CAPSULE | ORAL | 0 refills | Status: DC

## 2022-10-29 NOTE — Telephone Encounter (Signed)
Done

## 2022-12-30 ENCOUNTER — Other Ambulatory Visit: Payer: Self-pay | Admitting: Family Medicine

## 2022-12-30 NOTE — Telephone Encounter (Signed)
Prescription Request  12/30/2022  LOV: 03/22/2022  What is the name of the medication or equipment? amphetamine-dextroamphetamine (ADDERALL XR) 20 MG 24 hr capsule pt requesting refills so she does not have to return to pharmacy as much  Have you contacted your pharmacy to request a refill? No   Which pharmacy would you like this sent to?  [facility] #[id number] [place], [place] - [address] AT [facility] OF [facility] [address] [place] [address] [place] [address] Phone: [phone] Fax: [phone]    Patient notified that their request is being sent to the clinical staff for review and that they should receive a response within 2 business days.   Please advise at Mobile [phone] (mobile)

## 2022-12-30 NOTE — Addendum Note (Signed)
Addended by: Kathreen Devoid on: 12/30/2022 05:07 PM   Modules accepted: Orders

## 2022-12-31 MED ORDER — AMPHETAMINE-DEXTROAMPHET ER 20 MG PO CP24: 20.0000 mg | ORAL_CAPSULE | ORAL | 0 refills | Status: DC

## 2022-12-31 NOTE — Addendum Note (Signed)
Addended by: Gershon Crane A on: 12/31/2022 02:04 PM   Modules accepted: Orders

## 2022-12-31 NOTE — Telephone Encounter (Signed)
Pt is aware refill can take up to 3 business days 

## 2022-12-31 NOTE — Telephone Encounter (Signed)
Pt LOV was on 03/22/22 Last refill was done on 10/29/22 Please advise

## 2022-12-31 NOTE — Telephone Encounter (Signed)
Done

## 2023-01-16 ENCOUNTER — Telehealth: Payer: 59 | Admitting: Nurse Practitioner

## 2023-01-16 DIAGNOSIS — R112 Nausea with vomiting, unspecified: Secondary | ICD-10-CM

## 2023-01-16 DIAGNOSIS — R197 Diarrhea, unspecified: Secondary | ICD-10-CM | POA: Diagnosis not present

## 2023-01-16 MED ORDER — ONDANSETRON 4 MG PO TBDP: 4.0000 mg | ORAL_TABLET | Freq: Three times a day (TID) | ORAL | 0 refills | Status: DC | PRN

## 2023-01-16 NOTE — Progress Notes (Signed)
Virtual Visit Consent   Jo Castro, you are scheduled for a virtual visit with a [facility] provider today. Just as with appointments in the office, your consent must be obtained to participate. Your consent will be active for this visit and any virtual visit you may have with one of our providers in the next 365 days. If you have a MyChart account, a copy of this consent can be sent to you electronically.  As this is a virtual visit, video technology does not allow for your provider to perform a traditional examination. This may limit your provider's ability to fully assess your condition. If your provider identifies any concerns that need to be evaluated in person or the need to arrange testing (such as labs, EKG, etc.), we will make arrangements to do so. Although advances in technology are sophisticated, we cannot ensure that it will always work on either your end or our end. If the connection with a video visit is poor, the visit may have to be switched to a telephone visit. With either a video or telephone visit, we are not always able to ensure that we have a secure connection.  By engaging in this virtual visit, you consent to the provision of healthcare and authorize for your insurance to be billed (if applicable) for the services provided during this visit. Depending on your insurance coverage, you may receive a charge related to this service.  I need to obtain your verbal consent now. Are you willing to proceed with your visit today? Jo Castro has provided verbal consent on 01/16/2023 for a virtual visit (video or telephone). Jo Simas, FNP  Date: 01/16/2023 10:01 AM  Virtual Visit via Video Note   I, Jo Castro, connected with  Jo Castro  ([id number], 1995/07/13) on 01/16/23 at 10:00 AM EDT by a video-enabled telemedicine application and verified that I am speaking with the correct person using two identifiers.  Location: Patient: Virtual Visit Location Patient: Home Provider:  Virtual Visit Location Provider: Home Office   I discussed the limitations of evaluation and management by telemedicine and the availability of in person appointments. The patient expressed understanding and agreed to proceed.    History of Present Illness: Jo Castro is a 27 y.o. who identifies as a female who was assigned female at birth, and is being seen today for nausea/vomiting and diarrhea   Symptom onset was yesterday  Nausea and diarrhea has persisted for 24 hours  She has a fever today She vomited this morning   Denies any sick contacts  Last meal was eggs and ham at home   She has urinated this morning  She was able to keep fluids down yesterday  She has mainly been drinking water   She has not traveled anywhere recently   She has been using Motrin for relief  She started taking amoxicillin   Observations/Objective: Patient is well-developed, well-nourished in no acute distress.  Resting comfortably  at home.  Head is normocephalic, atraumatic.  No labored breathing.  Speech is clear and coherent with logical content.  Patient is alert and oriented at baseline.    Assessment and Plan: 1. Diarrhea, unspecified type Start with fluids, electrolytes (Gatorade) and slowly progress to [employer]  Avoid spicy/greasy/acidic and fried foods  Stop antibiotic  Hold motrin until food is tolerated   May use tylenol for pain relief and fever   2. Nausea and vomiting, unspecified vomiting type  - ondansetron (ZOFRAN-ODT) 4 MG disintegrating tablet; Take 1 tablet (4  mg total) by mouth every 8 (eight) hours as needed.  Dispense: 20 tablet; Refill: 0     Follow Up Instructions: I discussed the assessment and treatment plan with the patient. The patient was provided an opportunity to ask questions and all were answered. The patient agreed with the plan and demonstrated an understanding of the instructions.  A copy of instructions were sent to the patient via MyChart unless  otherwise noted below.    The patient was advised to call back or seek an in-person evaluation if the symptoms worsen or if the condition fails to improve as anticipated.  Time:  I spent 14 minutes with the patient via telehealth technology discussing the above problems/concerns.    Jo Simas, FNP

## 2023-03-07 ENCOUNTER — Telehealth: Payer: Self-pay | Admitting: Family Medicine

## 2023-03-07 NOTE — Telephone Encounter (Signed)
Please advise 

## 2023-03-07 NOTE — Telephone Encounter (Signed)
Tell her to ask if her pharmacy has Vyvanse available in any strength

## 2023-03-10 NOTE — Telephone Encounter (Signed)
Pt mom is calling and need new rx generic adderall xr 25 mg [facility] #[id number] [place], [place] - [address] AT [facility] OF [address] & [facility] [address] Phone: [phone]  Fax: [phone]

## 2023-03-11 MED ORDER — AMPHETAMINE-DEXTROAMPHET ER 25 MG PO CP24: 25.0000 mg | ORAL_CAPSULE | ORAL | 0 refills | Status: DC

## 2023-03-11 NOTE — Telephone Encounter (Signed)
Mother called to say the pharmacy currently has the amphetamine-dextroamphetamine (ADDERALL XR) 25 mg 24 hr capsules in stock.  Please send refill, at your earliest convenience.

## 2023-03-11 NOTE — Telephone Encounter (Signed)
Pt LOV was on 03/22/22 Last refill was on 12/31/22 Please advise

## 2023-03-11 NOTE — Telephone Encounter (Signed)
Please find out whether the pharmacy has Adderall XR or Vyvanse at all

## 2023-03-11 NOTE — Telephone Encounter (Signed)
I sent in RX for the 25 mg dose

## 2023-04-09 ENCOUNTER — Telehealth: Payer: Self-pay | Admitting: Family Medicine

## 2023-04-09 NOTE — Telephone Encounter (Signed)
amphetamine-dextroamphetamine (ADDERALL XR) 25 MG 24 hr capsule on backorder  they have 30mg  XR requesting a refill of the 30mg  XR  [facility] #[id number] [place], [place] - [address] AT [facility] OF [address] & [facility] [address] Phone: [phone]  Fax: [phone]

## 2023-04-11 MED ORDER — AMPHETAMINE-DEXTROAMPHET ER 30 MG PO CP24: 30.0000 mg | ORAL_CAPSULE | ORAL | 0 refills | Status: DC

## 2023-04-11 NOTE — Telephone Encounter (Signed)
Pt calling to check on progress of this request advised to allow up to 72 hrs

## 2023-04-11 NOTE — Telephone Encounter (Signed)
Left detailed message with advise that Rx for adderall was sent to her pharmacy

## 2023-04-11 NOTE — Addendum Note (Signed)
Addended by: Gershon Crane A on: 04/11/2023 09:18 AM   Modules accepted: Orders

## 2023-04-11 NOTE — Telephone Encounter (Signed)
I sent in one month of the 30 mg dose

## 2023-05-20 ENCOUNTER — Ambulatory Visit (INDEPENDENT_AMBULATORY_CARE_PROVIDER_SITE_OTHER): Payer: 59 | Admitting: Family Medicine

## 2023-05-20 ENCOUNTER — Encounter: Payer: Self-pay | Admitting: Family Medicine

## 2023-05-20 VITALS — BP 120/78 | HR 91 | Temp 98.2°F | Wt 166.8 lb

## 2023-05-20 DIAGNOSIS — Z23 Encounter for immunization: Secondary | ICD-10-CM | POA: Diagnosis not present

## 2023-05-20 DIAGNOSIS — N62 Hypertrophy of breast: Secondary | ICD-10-CM | POA: Insufficient documentation

## 2023-05-20 DIAGNOSIS — F900 Attention-deficit hyperactivity disorder, predominantly inattentive type: Secondary | ICD-10-CM | POA: Diagnosis not present

## 2023-05-20 MED ORDER — AMPHETAMINE-DEXTROAMPHET ER 30 MG PO CP24: 30.0000 mg | ORAL_CAPSULE | ORAL | 0 refills | Status: DC

## 2023-05-20 NOTE — Progress Notes (Signed)
   Subjective:    Patient ID: Jo Castro, female    DOB: Sep 12, 1995, 27 y.o.   MRN: [id number]  HPI Here to follow up on ADHD and to ask about the possibility of breast reduction surgery. Her ADHD has been stable, but she asks to increase the dose of her Adderall XR back to 30 mg daily. Due to availability issues, we went down to the 25 mg dose last fall, and she says the 30 mg dose works better for her. No side effects to report. Also she has dealt with chronic pain in her neck and upper back for years, and she says the weight of her large breasts is the main cause for this.    Review of Systems  Constitutional: Negative.   Respiratory: Negative.    Cardiovascular: Negative.   Musculoskeletal:  Positive for back pain and neck pain.  Psychiatric/Behavioral:  Positive for decreased concentration.        Objective:   Physical Exam Constitutional:      Appearance: Normal appearance.  Cardiovascular:     Rate and Rhythm: Normal rate and regular rhythm.     Pulses: Normal pulses.     Heart sounds: Normal heart sounds.  Pulmonary:     Effort: Pulmonary effort is normal.     Breath sounds: Normal breath sounds.  Neurological:     General: No focal deficit present.     Mental Status: She is alert and oriented to person, place, and time.           Assessment & Plan:  For her ADHD, we will change her Adderall XR back to 30 mg daily. For the large breasts, we will refer her to Plastic Surgery to discuss possible reduction surgery.  Gershon Crane, MD

## 2023-05-20 NOTE — Addendum Note (Signed)
Addended by: Carola Rhine on: 05/20/2023 05:16 PM   Modules accepted: Orders

## 2023-06-27 ENCOUNTER — Ambulatory Visit (INDEPENDENT_AMBULATORY_CARE_PROVIDER_SITE_OTHER): Payer: 59 | Admitting: Family Medicine

## 2023-06-27 VITALS — BP 96/74 | HR 66 | Temp 97.4°F | Ht 66.5 in | Wt 169.0 lb

## 2023-06-27 DIAGNOSIS — Z0000 Encounter for general adult medical examination without abnormal findings: Secondary | ICD-10-CM | POA: Diagnosis not present

## 2023-06-27 LAB — CBC WITH DIFFERENTIAL/PLATELET
Basophils Absolute: 0.1 10*3/uL (ref 0.0–0.1)
Basophils Relative: 0.9 % (ref 0.0–3.0)
Eosinophils Absolute: 0.1 10*3/uL (ref 0.0–0.7)
Eosinophils Relative: 1.6 % (ref 0.0–5.0)
HCT: 41.6 % (ref 36.0–46.0)
Hemoglobin: 14.3 g/dL (ref 12.0–15.0)
Lymphocytes Relative: 36.1 % (ref 12.0–46.0)
Lymphs Abs: 2 10*3/uL (ref 0.7–4.0)
MCHC: 34.4 g/dL (ref 30.0–36.0)
MCV: 89.7 fL (ref 78.0–100.0)
Monocytes Absolute: 0.4 10*3/uL (ref 0.1–1.0)
Monocytes Relative: 6.6 % (ref 3.0–12.0)
Neutro Abs: 3.1 10*3/uL (ref 1.4–7.7)
Neutrophils Relative %: 54.8 % (ref 43.0–77.0)
Platelets: 340 10*3/uL (ref 150.0–400.0)
RBC: 4.64 Mil/uL (ref 3.87–5.11)
RDW: 13 % (ref 11.5–15.5)
WBC: 5.6 10*3/uL (ref 4.0–10.5)

## 2023-06-27 LAB — LIPID PANEL
Cholesterol: 176 mg/dL (ref 0–200)
HDL: 86.1 mg/dL (ref >39.00)
LDL Cholesterol: 81 mg/dL (ref 0–99)
NonHDL: 89.5
Total CHOL/HDL Ratio: 2
Triglycerides: 41 mg/dL (ref 0.0–149.0)
VLDL: 8.2 mg/dL (ref 0.0–40.0)

## 2023-06-27 LAB — TSH: TSH: 2.1 u[IU]/mL (ref 0.35–5.50)

## 2023-06-27 LAB — BASIC METABOLIC PANEL
BUN: 17 mg/dL (ref 6–23)
CO2: 27 meq/L (ref 19–32)
Calcium: 9.6 mg/dL (ref 8.4–10.5)
Chloride: 106 meq/L (ref 96–112)
Creatinine, Ser: 0.9 mg/dL (ref 0.40–1.20)
GFR: 87.78 mL/min (ref >60.00)
Glucose, Bld: 88 mg/dL (ref 70–99)
Potassium: 4.9 meq/L (ref 3.5–5.1)
Sodium: 140 meq/L (ref 135–145)

## 2023-06-27 LAB — HEPATIC FUNCTION PANEL
ALT: 15 U/L (ref 0–35)
AST: 16 U/L (ref 0–37)
Albumin: 4.5 g/dL (ref 3.5–5.2)
Alkaline Phosphatase: 45 U/L (ref 39–117)
Bilirubin, Direct: 0.1 mg/dL (ref 0.0–0.3)
Total Bilirubin: 0.6 mg/dL (ref 0.2–1.2)
Total Protein: 6.8 g/dL (ref 6.0–8.3)

## 2023-06-27 LAB — HEMOGLOBIN A1C: Hgb A1c MFr Bld: 5.8 % (ref 4.6–6.5)

## 2023-06-27 NOTE — Progress Notes (Signed)
   Subjective:    Patient ID: Jo Castro, female    DOB: Jan 07, 1996, 28 y.o.   MRN: [id number]  HPI Here for a well exam. She is doing well. Her ADHD is well controlled. We referred her to see Dr. Lowery to consider breast reduction surgery, and she is scheduled to see her next month.    Review of Systems  Constitutional: Negative.   HENT: Negative.    Eyes: Negative.   Respiratory: Negative.    Cardiovascular: Negative.   Gastrointestinal: Negative.   Genitourinary:  Negative for decreased urine volume, difficulty urinating, dyspareunia, dysuria, enuresis, flank pain, frequency, hematuria, pelvic pain and urgency.  Musculoskeletal:  Positive for back pain and neck pain.  Skin: Negative.   Neurological: Negative.  Negative for headaches.  Psychiatric/Behavioral: Negative.         Objective:   Physical Exam Constitutional:      General: She is not in acute distress.    Appearance: Normal appearance. She is well-developed.  HENT:     Head: Normocephalic and atraumatic.     Right Ear: External ear normal.     Left Ear: External ear normal.     Nose: Nose normal.     Mouth/Throat:     Pharynx: No oropharyngeal exudate.  Eyes:     General: No scleral icterus.    Conjunctiva/sclera: Conjunctivae normal.     Pupils: Pupils are equal, round, and reactive to light.  Neck:     Thyroid : No thyromegaly.     Vascular: No JVD.  Cardiovascular:     Rate and Rhythm: Normal rate and regular rhythm.     Pulses: Normal pulses.     Heart sounds: Normal heart sounds. No murmur heard.    No friction rub. No gallop.  Pulmonary:     Effort: Pulmonary effort is normal. No respiratory distress.     Breath sounds: Normal breath sounds. No wheezing or rales.  Chest:     Chest wall: No tenderness.  Abdominal:     General: Bowel sounds are normal. There is no distension.     Palpations: Abdomen is soft. There is no mass.     Tenderness: There is no abdominal tenderness. There is no  guarding or rebound.  Musculoskeletal:        General: No tenderness. Normal range of motion.     Cervical back: Normal range of motion and neck supple.  Lymphadenopathy:     Cervical: No cervical adenopathy.  Skin:    General: Skin is warm and dry.     Findings: No erythema or rash.  Neurological:     General: No focal deficit present.     Mental Status: She is alert and oriented to person, place, and time.     Cranial Nerves: No cranial nerve deficit.     Motor: No abnormal muscle tone.     Coordination: Coordination normal.     Deep Tendon Reflexes: Reflexes are normal and symmetric. Reflexes normal.  Psychiatric:        Mood and Affect: Mood normal.        Behavior: Behavior normal.        Thought Content: Thought content normal.        Judgment: Judgment normal.           Assessment & Plan:  Well exam. We discussed diet and exercise. Get fasting labs. Garnette Olmsted, MD

## 2023-08-05 ENCOUNTER — Institutional Professional Consult (permissible substitution): Payer: 59 | Admitting: Plastic Surgery

## 2023-09-10 ENCOUNTER — Other Ambulatory Visit: Payer: Self-pay | Admitting: Family Medicine

## 2023-09-10 MED ORDER — AMPHETAMINE-DEXTROAMPHET ER 30 MG PO CP24
30.0000 mg | ORAL_CAPSULE | ORAL | 0 refills | Status: DC
Start: 2023-09-10 — End: 2023-10-16

## 2023-09-10 MED ORDER — AMPHETAMINE-DEXTROAMPHET ER 30 MG PO CP24: 30.0000 mg | ORAL_CAPSULE | ORAL | 0 refills | Status: DC

## 2023-09-10 NOTE — Telephone Encounter (Signed)
 Done

## 2023-09-10 NOTE — Telephone Encounter (Signed)
 Copied from CRM [id number]. Topic: Clinical - Medication Refill >> Sep 10, 2023 11:13 AM Josefa Half C wrote: Most Recent Primary Care Visit:  Provider: Gershon Crane A  Department: [facility]  Visit Type: PHYSICAL  Date: 06/27/2023  Medication: amphetamine-dextroamphetamine (ADDERALL XR) 30 MG 24 hr capsule  Has the patient contacted their pharmacy? No, patient contacted provider to initiate RX refill (Agent: If no, request that the patient contact the pharmacy for the refill. If patient does not wish to contact the pharmacy document the reason why and proceed with request.) (Agent: If yes, when and what did the pharmacy advise?)  Is this the correct pharmacy for this prescription? Yes If no, delete pharmacy and type the correct one.  This is the patient's preferred pharmacy:  [facility] #[id number] [place], [place] - [address] AT [facility] OF [facility] [address] & [facility] [address] [place] [address] Phone: [phone] Fax: [phone]   Has the prescription been filled recently? No  Is the patient out of the medication? Yes  Has the patient been seen for an appointment in the last year OR does the patient have an upcoming appointment? Yes  Can we respond through MyChart? Yes  Agent: Please be advised that Rx refills may take up to 3 business days. We ask that you follow-up with your pharmacy.

## 2023-10-15 ENCOUNTER — Other Ambulatory Visit: Payer: Self-pay | Admitting: Family Medicine

## 2023-10-16 ENCOUNTER — Telehealth: Payer: Self-pay | Admitting: *Deleted

## 2023-10-16 MED ORDER — AMPHETAMINE-DEXTROAMPHET ER 25 MG PO CP24: 25.0000 mg | ORAL_CAPSULE | ORAL | 0 refills | Status: DC

## 2023-10-16 MED ORDER — AMPHETAMINE-DEXTROAMPHET ER 25 MG PO CP24
25.0000 mg | ORAL_CAPSULE | ORAL | 0 refills | Status: DC
Start: 2023-10-16 — End: 2024-01-19

## 2023-10-16 NOTE — Telephone Encounter (Signed)
 Copied from CRM [id number]. Topic: Clinical - Prescription Issue >> Oct 16, 2023  8:07 AM Zipporah Him wrote: Reason for CRM: Patient is needing a refill of her amphetamine -dextroamphetamine (ADDERALL XR) 30 MG 24 hr capsule, pharmacy states that they do not have the 30 mg. Patient is wanting to know if she can get 25 mg prescription sent over as they state they do have those in stock and she said something is better than nothing because she feels these are important for helping her at work, driving, etc. Please call to advise if this is possible patient is very worried about prescription as she only has one left.

## 2023-10-16 NOTE — Telephone Encounter (Signed)
I sent in RX for the 25 mg dose

## 2023-10-17 NOTE — Telephone Encounter (Signed)
 FYI This has been done,please close pt chart

## 2023-11-25 ENCOUNTER — Ambulatory Visit: Payer: 59 | Admitting: Plastic Surgery

## 2023-11-25 ENCOUNTER — Encounter: Payer: Self-pay | Admitting: Plastic Surgery

## 2023-11-25 VITALS — BP 130/79 | HR 82 | Ht 68.0 in | Wt 168.2 lb

## 2023-11-25 DIAGNOSIS — Z803 Family history of malignant neoplasm of breast: Secondary | ICD-10-CM | POA: Diagnosis not present

## 2023-11-25 DIAGNOSIS — G8929 Other chronic pain: Secondary | ICD-10-CM

## 2023-11-25 DIAGNOSIS — M546 Pain in thoracic spine: Secondary | ICD-10-CM | POA: Diagnosis not present

## 2023-11-25 DIAGNOSIS — N62 Hypertrophy of breast: Secondary | ICD-10-CM

## 2023-11-25 DIAGNOSIS — M542 Cervicalgia: Secondary | ICD-10-CM

## 2023-11-25 DIAGNOSIS — M549 Dorsalgia, unspecified: Secondary | ICD-10-CM | POA: Insufficient documentation
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
## 2023-12-06 ENCOUNTER — Encounter: Payer: Self-pay | Admitting: Family Medicine

## 2023-12-09 NOTE — Telephone Encounter (Signed)
 I assume Dr. Fulton Job has taken care of this

## 2024-01-15 ENCOUNTER — Telehealth: Payer: Self-pay | Admitting: *Deleted

## 2024-01-15 NOTE — Telephone Encounter (Signed)
 Copied from CRM [id number]. Topic: Clinical - Prescription Issue >> Jan 12, 2024  9:18 AM Jo Castro wrote: Reason for CRM: Patient went to pick up amphetamine -dextroamphetamine (ADDERALL XR) 25 MG 24 hr capsule but she is stating that it give her a headache and she would prefer the 30mg  prescription//Patient would like a call on how to proceed//She also would like to know if the 30 mg can be called into the pharmacy

## 2024-01-16 ENCOUNTER — Telehealth: Payer: Self-pay | Admitting: Family Medicine

## 2024-01-16 NOTE — Telephone Encounter (Signed)
 Copied from CRM #[id number]. Topic: Clinical - Medication Refill >> Jan 16, 2024  8:31 AM Macario HERO wrote: Medication: amphetamine -dextroamphetamine (ADDERALL XR) 30 MG 24 hr capsule [[id number]]  Has the patient contacted their pharmacy? No (Agent: If no, request that the patient contact the pharmacy for the refill. If patient does not wish to contact the pharmacy document the reason why and proceed with request.) (Agent: If yes, when and what did the pharmacy advise?)  This is the patient's preferred pharmacy:  [facility] #[id number] [place], [place] - [address] AT [facility] OF [facility] [address] & [facility] [address] [place] [address] Phone: [phone] Fax: [phone]  Is this the correct pharmacy for this prescription? Yes If no, delete pharmacy and type the correct one.   Has the prescription been filled recently? Yes  Is the patient out of the medication? Yes  Has the patient been seen for an appointment in the last year OR does the patient have an upcoming appointment? Yes  Can we respond through MyChart? Yes  Agent: Please be advised that Rx refills may take up to 3 business days. We ask that you follow-up with your pharmacy.

## 2024-01-19 MED ORDER — AMPHETAMINE-DEXTROAMPHET ER 25 MG PO CP24: 25.0000 mg | ORAL_CAPSULE | ORAL | 0 refills | Status: DC

## 2024-01-19 MED ORDER — AMPHETAMINE-DEXTROAMPHET ER 30 MG PO CP24: 30.0000 mg | ORAL_CAPSULE | Freq: Every day | ORAL | 0 refills | Status: DC

## 2024-01-19 NOTE — Telephone Encounter (Signed)
 Done

## 2024-01-19 NOTE — Telephone Encounter (Signed)
 I do not understand. If the 25 mg dose gives her headaches, the 30 mg dose would be even worse

## 2024-01-19 NOTE — Telephone Encounter (Signed)
 Spoke to the patient and a virtual visit is scheduled.

## 2024-01-19 NOTE — Addendum Note (Signed)
 Addended by: JOHNNY SENIOR A on: 01/19/2024 12:22 PM   Modules accepted: Orders

## 2024-01-21 ENCOUNTER — Encounter: Payer: Self-pay | Admitting: Family Medicine

## 2024-01-21 ENCOUNTER — Telehealth: Admitting: Family Medicine

## 2024-01-21 DIAGNOSIS — F900 Attention-deficit hyperactivity disorder, predominantly inattentive type: Secondary | ICD-10-CM

## 2024-01-21 DIAGNOSIS — N62 Hypertrophy of breast: Secondary | ICD-10-CM

## 2024-01-21 MED ORDER — AMPHETAMINE-DEXTROAMPHET ER 30 MG PO CP24: 30.0000 mg | ORAL_CAPSULE | Freq: Every day | ORAL | 0 refills | Status: DC
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
## 2024-05-10 ENCOUNTER — Other Ambulatory Visit: Payer: Self-pay | Admitting: Family Medicine

## 2024-05-10 NOTE — Telephone Encounter (Unsigned)
 Copied from CRM #[id number]. Topic: Clinical - Medication Refill >> May 10, 2024  8:49 AM Mesmerise C wrote: Medication: amphetamine -dextroamphetamine (ADDERALL XR) 30 MG 24 hr capsule  Has the patient contacted their pharmacy? Yes (Agent: If no, request that the patient contact the pharmacy for the refill. If patient does not wish to contact the pharmacy document the reason why and proceed with request.) (Agent: If yes, when and what did the pharmacy advise?) They didn't have refill on file This is the patient's preferred pharmacy:  [id number] [place], [place] - [address] AT [facility] OF [facility] [address] & [facility] [address] [place] [address] Phone: [phone] Fax: [phone]  Is this the correct pharmacy for this prescription? Yes If no, delete pharmacy and type the correct one.   Has the prescription been filled recently? No  Is the patient out of the medication? No Has one more for tomorrow  Has the patient been seen for an appointment in the last year OR does the patient have an upcoming appointment? Yes  Can we respond through MyChart? Yes  Agent: Please be advised that Rx refills may take up to 3 business days. We ask that you follow-up with your pharmacy.

## 2024-05-12 MED ORDER — AMPHETAMINE-DEXTROAMPHET ER 30 MG PO CP24: 30.0000 mg | ORAL_CAPSULE | Freq: Every day | ORAL | 0 refills | Status: AC

## 2024-05-12 NOTE — Telephone Encounter (Signed)
 Done
